# Patient Record
Sex: Female | Born: 1960 | Race: White | Hispanic: No | Marital: Married | Smoking: Never smoker
Health system: Southern US, Community
[De-identification: ages and names within clinical notes are randomized; demographics above are authoritative.]

## PROBLEM LIST (undated history)

## (undated) DIAGNOSIS — R413 Other amnesia: Secondary | ICD-10-CM

## (undated) DIAGNOSIS — F419 Anxiety disorder, unspecified: Secondary | ICD-10-CM

## (undated) HISTORY — DX: Anxiety disorder, unspecified: F41.9

## (undated) HISTORY — DX: Other amnesia: R41.3

---

## 2005-08-06 ENCOUNTER — Emergency Department (HOSPITAL_COMMUNITY): Admission: EM | Admit: 2005-08-06 | Discharge: 2005-08-07 | Payer: Self-pay | Admitting: Emergency Medicine

## 2005-12-18 ENCOUNTER — Encounter: Admission: RE | Admit: 2005-12-18 | Discharge: 2005-12-18 | Payer: Self-pay | Admitting: Emergency Medicine

## 2006-01-28 ENCOUNTER — Encounter: Admission: RE | Admit: 2006-01-28 | Discharge: 2006-01-28 | Payer: Self-pay | Admitting: Emergency Medicine

## 2006-03-04 ENCOUNTER — Encounter: Admission: RE | Admit: 2006-03-04 | Discharge: 2006-03-04 | Payer: Self-pay | Admitting: Emergency Medicine

## 2006-04-29 ENCOUNTER — Encounter: Admission: RE | Admit: 2006-04-29 | Discharge: 2006-04-29 | Payer: Self-pay | Admitting: Emergency Medicine

## 2006-11-04 IMAGING — CT CT HEAD W/O CM
1 series · 16 of 28 positions shown, 20 images · IV contrast (agent unspecified)
Comparison: none

CLINICAL DATA: Syncopal episodes.  The patient fell and hit her head.  
 HEAD CT WITHOUT CONTRAST:
TECHNIQUE: Contiguous axial images were obtained from the base of the skull through the vertex according to standard protocol without contrast.
 There is no evidence of intracranial hemorrhage, brain edema, or mass effect.  No other intra-axial abnormalities are seen, and the ventricles are within normal limits.  No abnormal extra-axial fluid collections or masses are identified.  No skull abnormalities are noted.

[Series 2: head · axial · 0.49mm/px · z∈[+30,+164]mm · 16 of 28 slices shown, 20 images]
[im 2/28  brain]
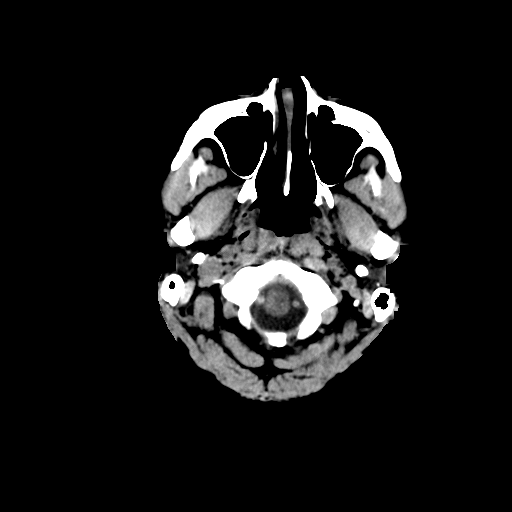
[im 2/28  bone]
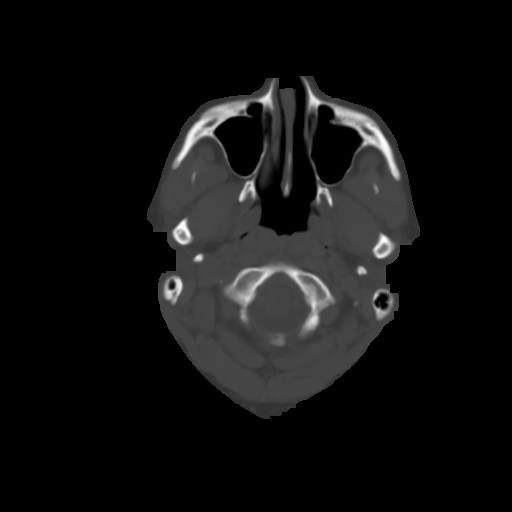
[im 4/28  brain]
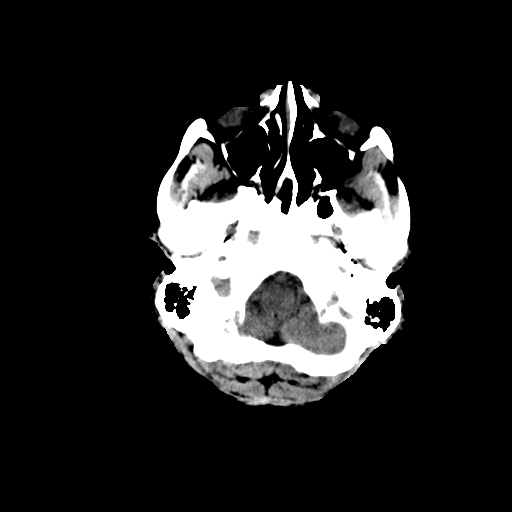
[im 6/28  brain]
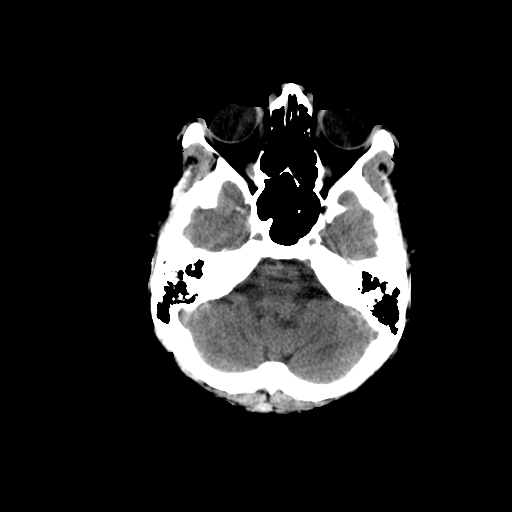
[im 7/28  brain]
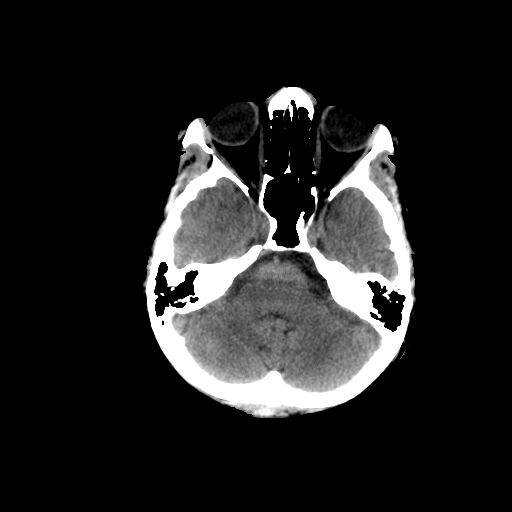
[im 9/28  brain]
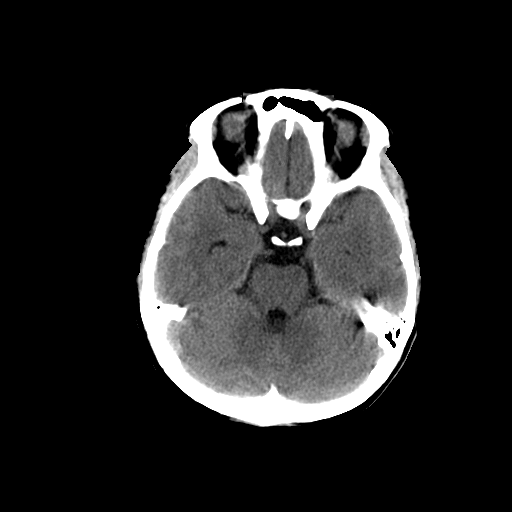
[im 9/28  bone]
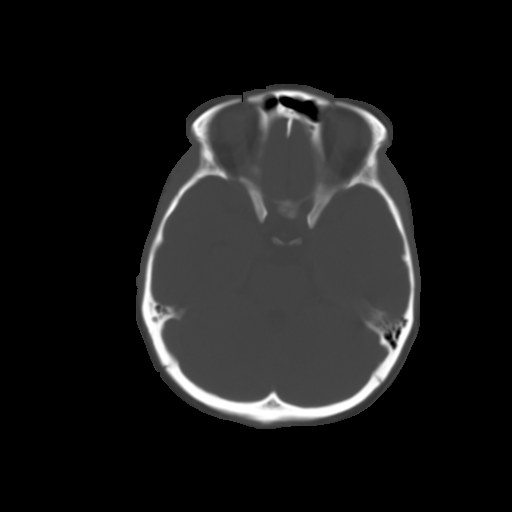
[im 10/28  brain]
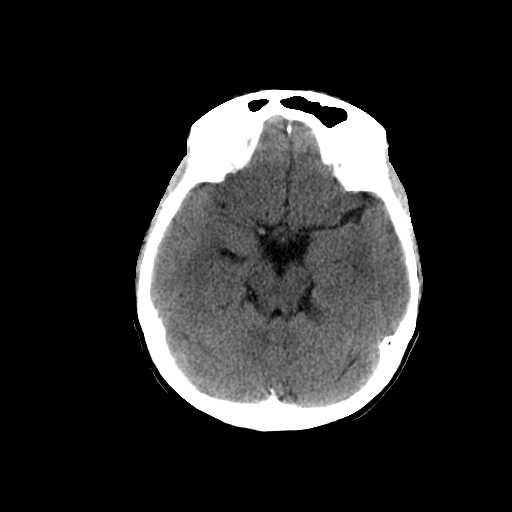
[im 12/28  brain]
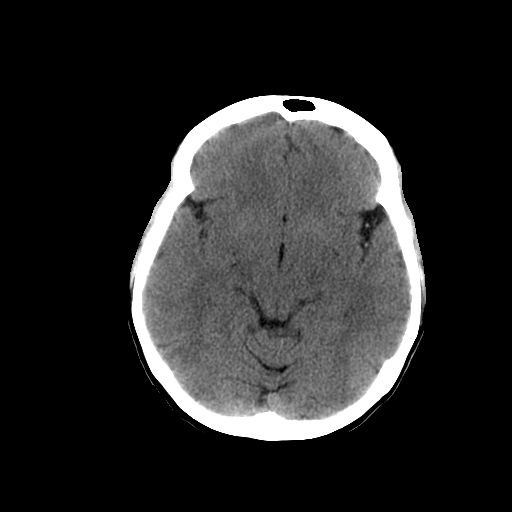
[im 14/28  brain]
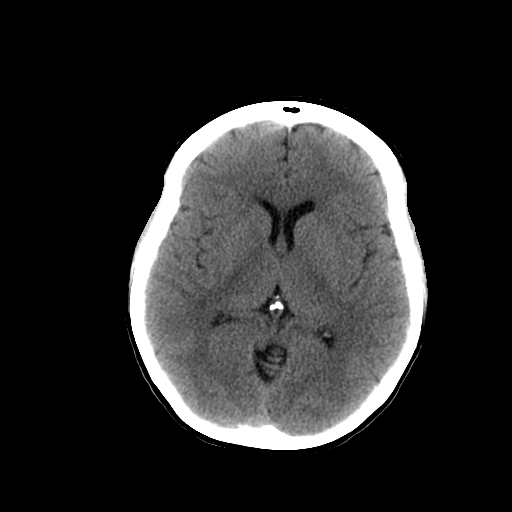
[im 15/28  brain]
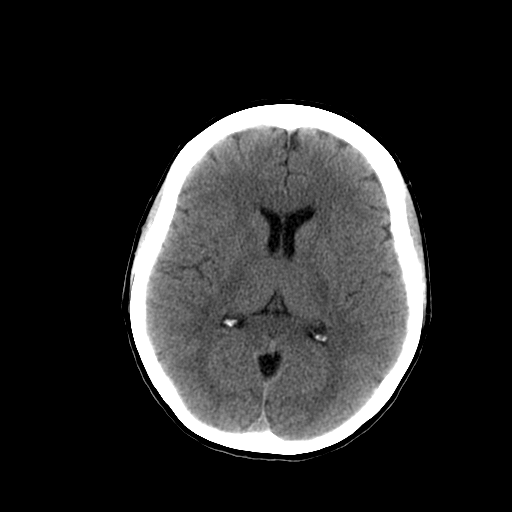
[im 15/28  bone]
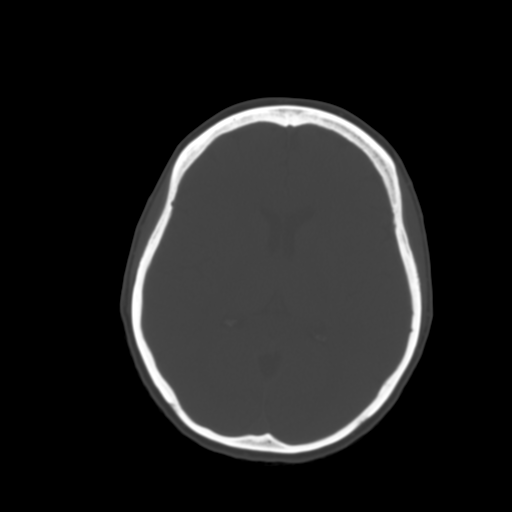
[im 17/28  brain]
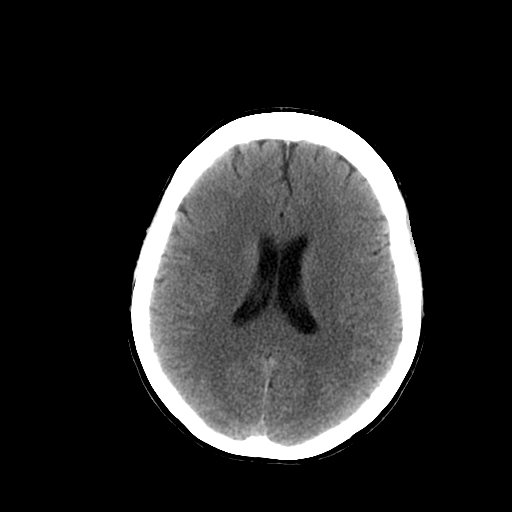
[im 19/28  brain]
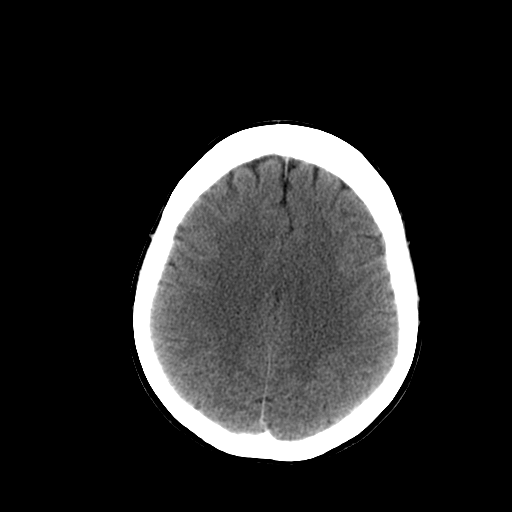
[im 20/28  brain]
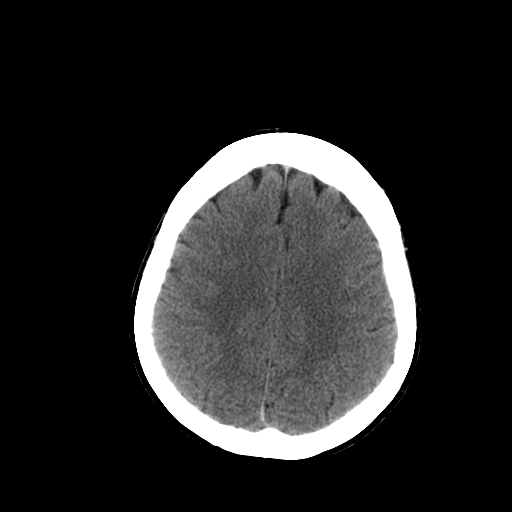
[im 22/28  brain]
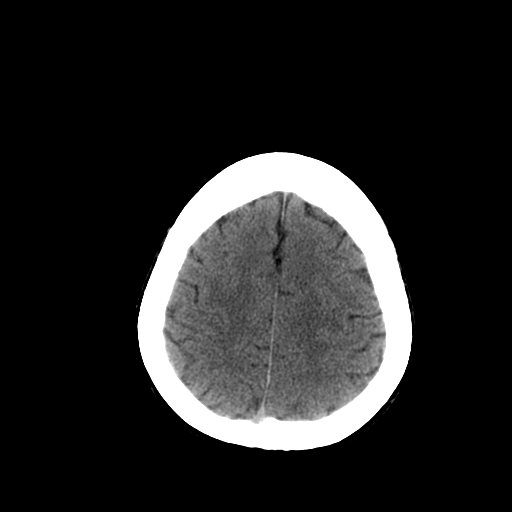
[im 22/28  bone]
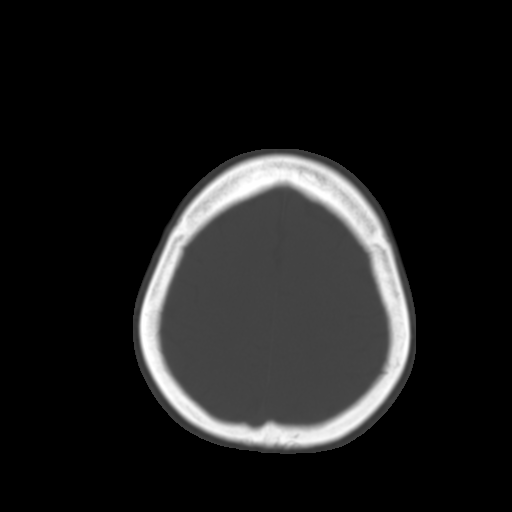
[im 23/28  brain]
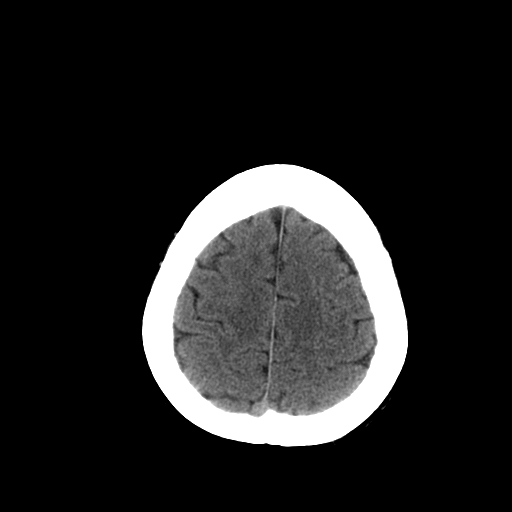
[im 25/28  brain]
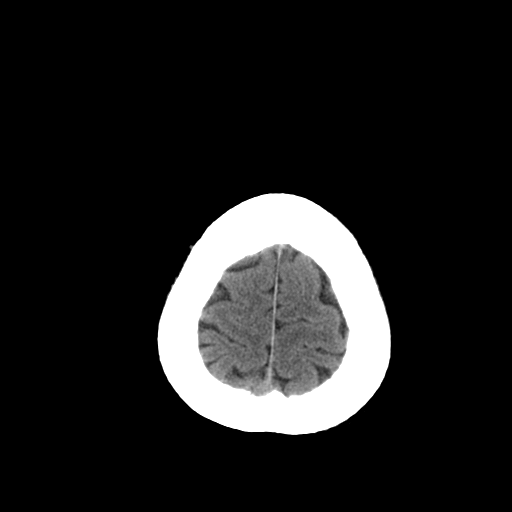
[im 27/28  brain]
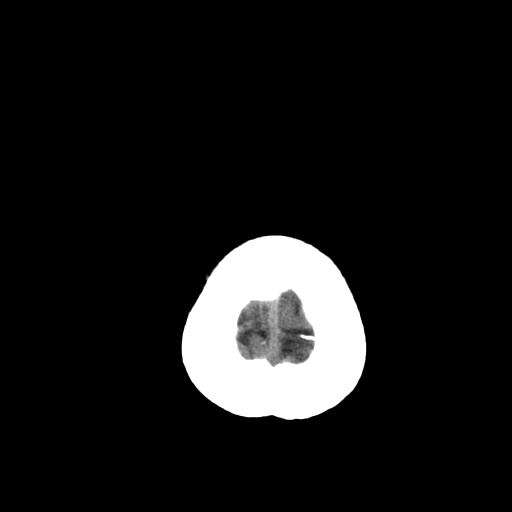

[16 of 28 positions shown; findings below may reference images not displayed]

IMPRESSION: Negative non-contrast head CT.

## 2007-06-01 ENCOUNTER — Other Ambulatory Visit: Admission: RE | Admit: 2007-06-01 | Discharge: 2007-06-01 | Payer: Self-pay | Admitting: Family Medicine

## 2008-06-06 ENCOUNTER — Other Ambulatory Visit: Admission: RE | Admit: 2008-06-06 | Discharge: 2008-06-06 | Payer: Self-pay | Admitting: Family Medicine

## 2010-12-30 ENCOUNTER — Encounter: Payer: Self-pay | Admitting: Emergency Medicine

## 2011-08-22 ENCOUNTER — Other Ambulatory Visit: Payer: Self-pay | Admitting: Family Medicine

## 2011-08-22 DIAGNOSIS — R270 Ataxia, unspecified: Secondary | ICD-10-CM

## 2011-08-25 ENCOUNTER — Ambulatory Visit
Admission: RE | Admit: 2011-08-25 | Discharge: 2011-08-25 | Disposition: A | Discharge status: Home / Self Care | Payer: BC Managed Care – PPO | Source: Ambulatory Visit | Attending: Family Medicine | Admitting: Family Medicine

## 2011-08-25 DIAGNOSIS — R270 Ataxia, unspecified: Secondary | ICD-10-CM

## 2011-08-25 MED ORDER — GADOBENATE DIMEGLUMINE 529 MG/ML IV SOLN
15.0000 mL | Freq: Once | INTRAVENOUS | Status: AC | PRN
  Administered 2011-08-25: 15 mL via INTRAVENOUS

## 2013-01-28 ENCOUNTER — Other Ambulatory Visit: Payer: Self-pay | Admitting: Family Medicine

## 2013-01-28 ENCOUNTER — Other Ambulatory Visit (HOSPITAL_COMMUNITY)
Admission: RE | Admit: 2013-01-28 | Discharge: 2013-01-28 | Disposition: A | Discharge status: Home / Self Care | Payer: BC Managed Care – PPO | Source: Ambulatory Visit | Attending: Family Medicine | Admitting: Family Medicine

## 2013-01-28 DIAGNOSIS — Z124 Encounter for screening for malignant neoplasm of cervix: Secondary | ICD-10-CM | POA: Insufficient documentation

## 2013-01-28 DIAGNOSIS — Z1151 Encounter for screening for human papillomavirus (HPV): Secondary | ICD-10-CM | POA: Insufficient documentation

## 2013-09-20 ENCOUNTER — Encounter: Payer: Self-pay | Admitting: Neurology

## 2013-09-20 ENCOUNTER — Ambulatory Visit (INDEPENDENT_AMBULATORY_CARE_PROVIDER_SITE_OTHER): Payer: BC Managed Care – PPO | Admitting: Neurology

## 2013-09-20 ENCOUNTER — Encounter (INDEPENDENT_AMBULATORY_CARE_PROVIDER_SITE_OTHER): Payer: Self-pay

## 2013-09-20 VITALS — BP 117/82 | HR 82 | Ht 64.0 in | Wt 181.0 lb

## 2013-09-20 DIAGNOSIS — R269 Unspecified abnormalities of gait and mobility: Secondary | ICD-10-CM | POA: Insufficient documentation

## 2013-09-20 DIAGNOSIS — R413 Other amnesia: Secondary | ICD-10-CM
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
## 2013-10-13 ENCOUNTER — Other Ambulatory Visit: Payer: Self-pay | Admitting: Neurology

## 2013-10-13 DIAGNOSIS — R413 Other amnesia: Secondary | ICD-10-CM

## 2014-01-21 ENCOUNTER — Ambulatory Visit: Payer: BC Managed Care – PPO | Admitting: Neurology

## 2014-02-22 ENCOUNTER — Other Ambulatory Visit: Payer: Self-pay | Admitting: Family Medicine

## 2014-02-22 DIAGNOSIS — R748 Abnormal levels of other serum enzymes: Secondary | ICD-10-CM

## 2014-02-24 ENCOUNTER — Ambulatory Visit
Admission: RE | Admit: 2014-02-24 | Discharge: 2014-02-24 | Disposition: A | Discharge status: Home / Self Care | Payer: BC Managed Care – PPO | Source: Ambulatory Visit | Attending: Family Medicine | Admitting: Family Medicine

## 2014-02-24 DIAGNOSIS — R748 Abnormal levels of other serum enzymes: Secondary | ICD-10-CM

## 2014-10-27 IMAGING — US US ABDOMEN COMPLETE
1 series · 14 of 25 positions shown · non-contrast
Comparison: None.

CLINICAL DATA: Elevated liver function tests

EXAM:
ULTRASOUND ABDOMEN COMPLETE

[Series 1: us abdomen complete · 0.31mm/px · 14 of 72 slices shown]
[im 1/72]
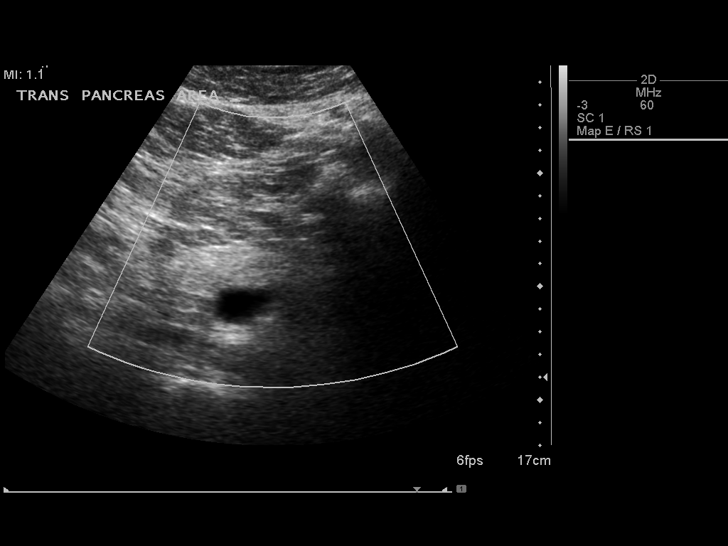
[im 6/72]
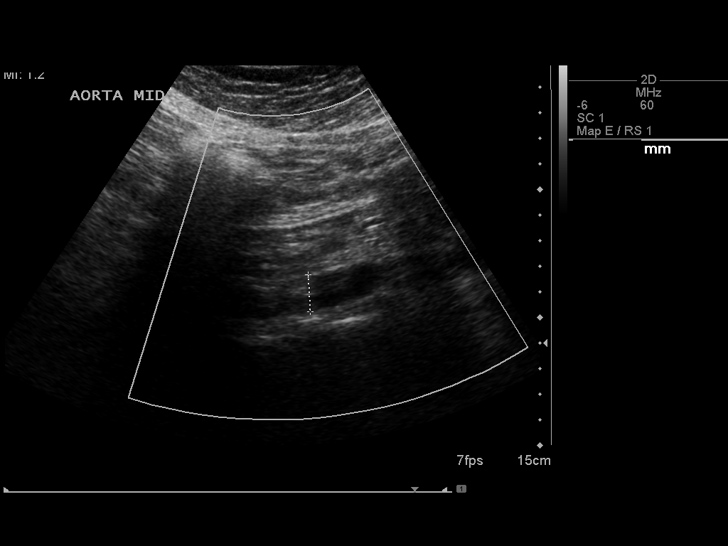
[im 12/72]
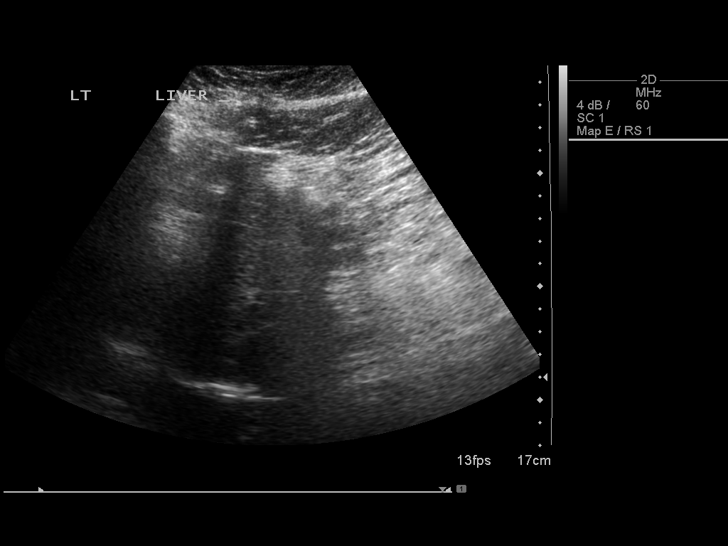
[im 18/72]
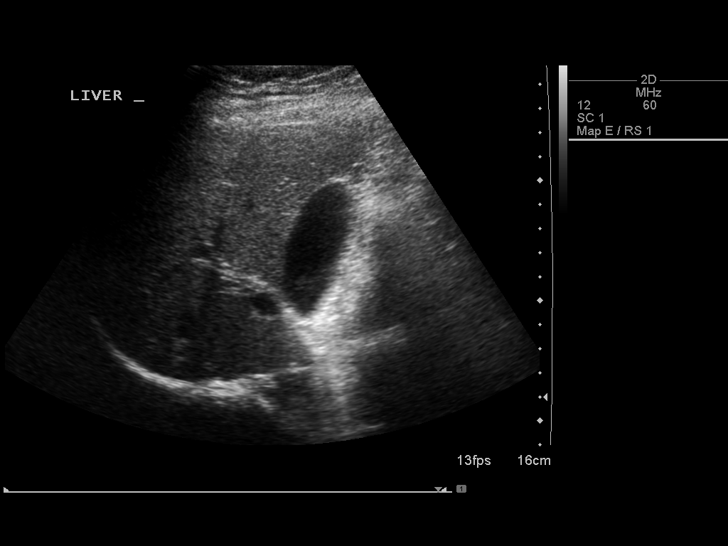
[im 24/72]
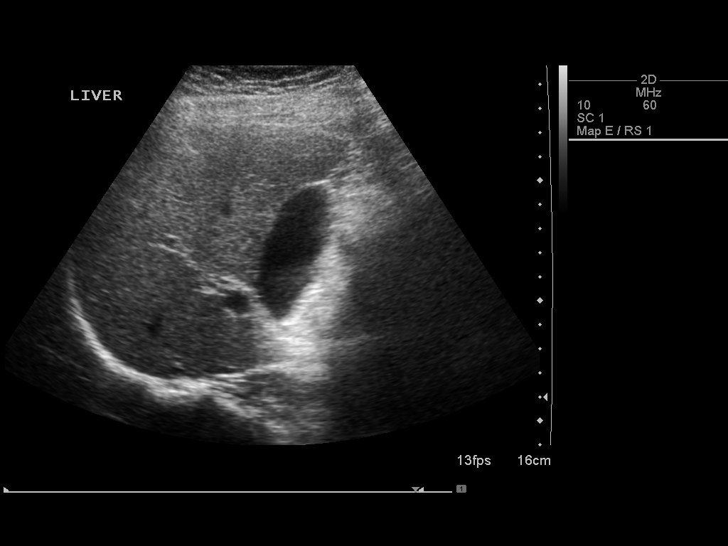
[im 27/72]
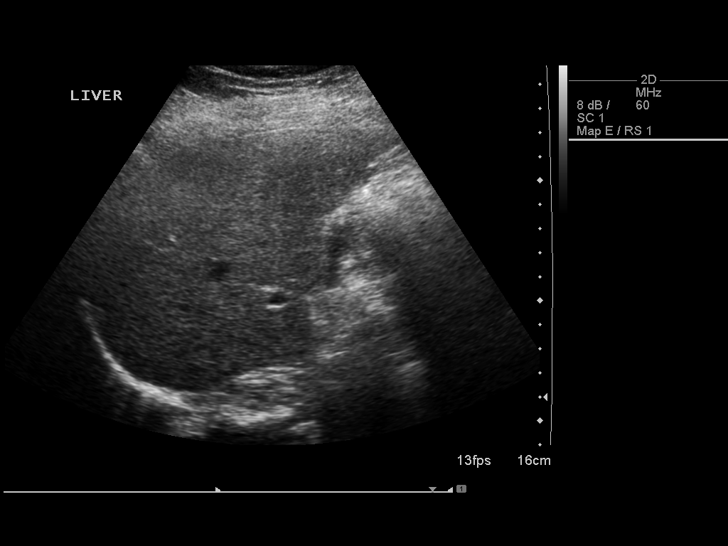
[im 33/72]
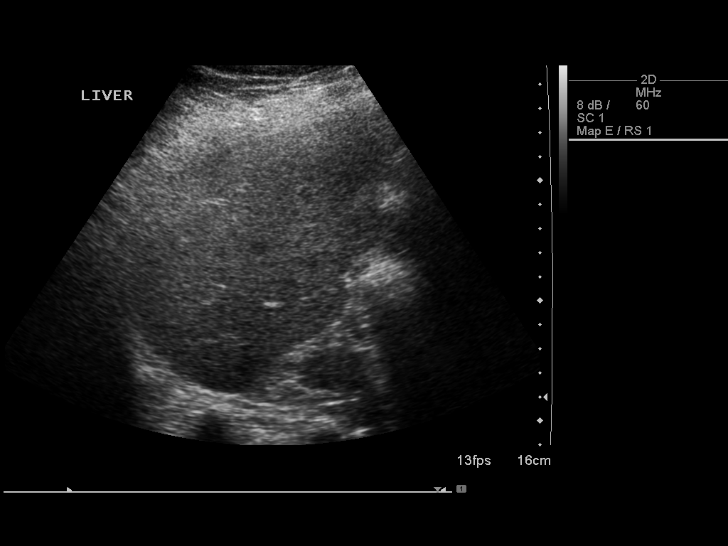
[im 39/72]
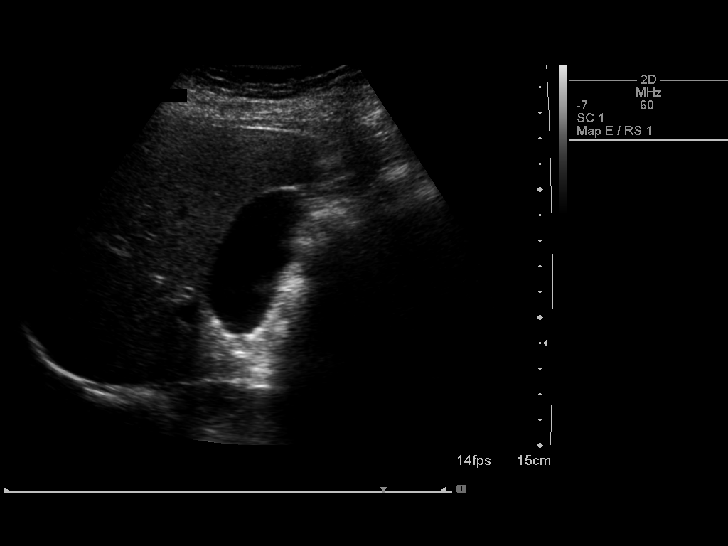
[im 45/72]
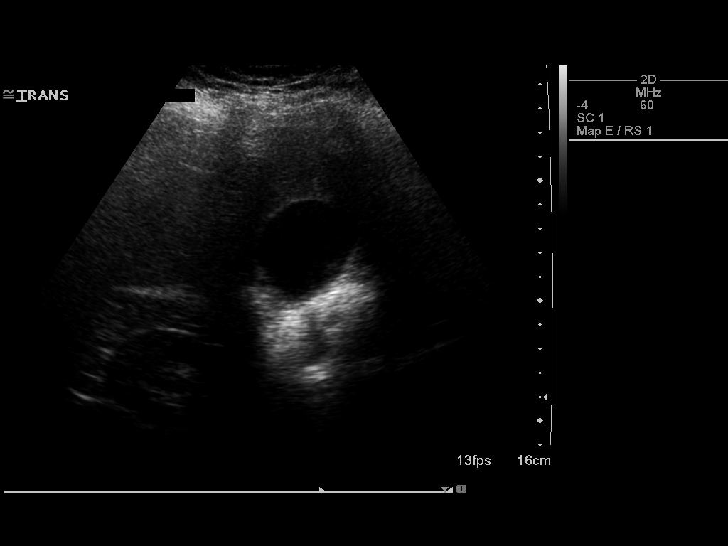
[im 48/72]
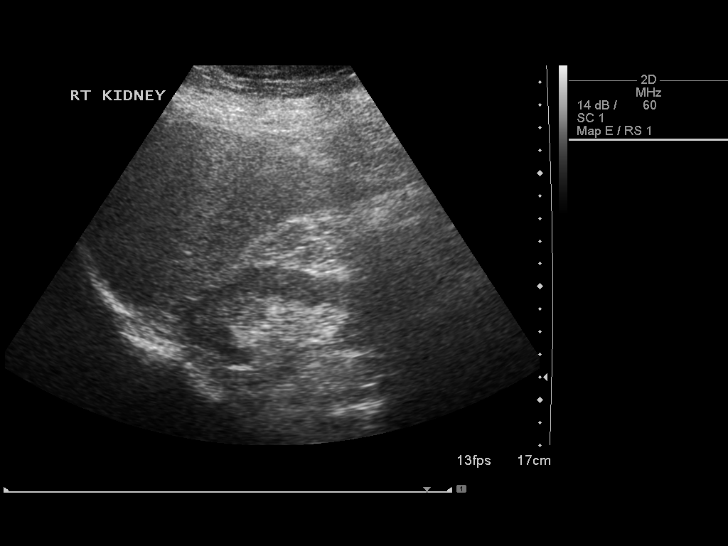
[im 54/72]
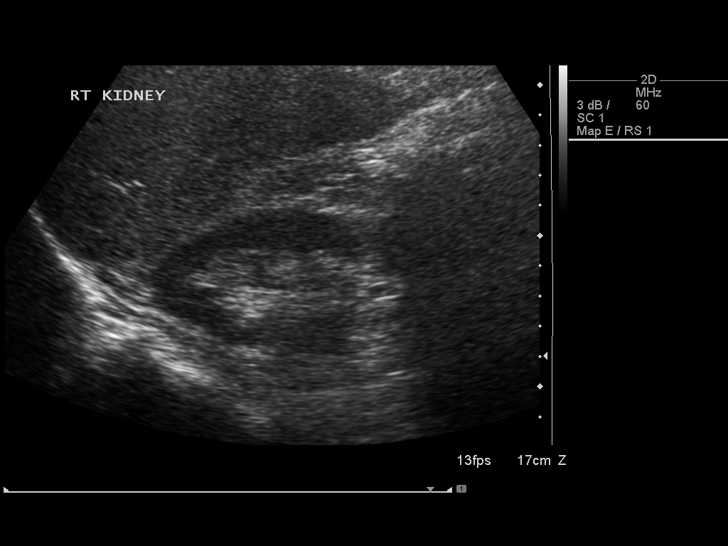
[im 60/72]
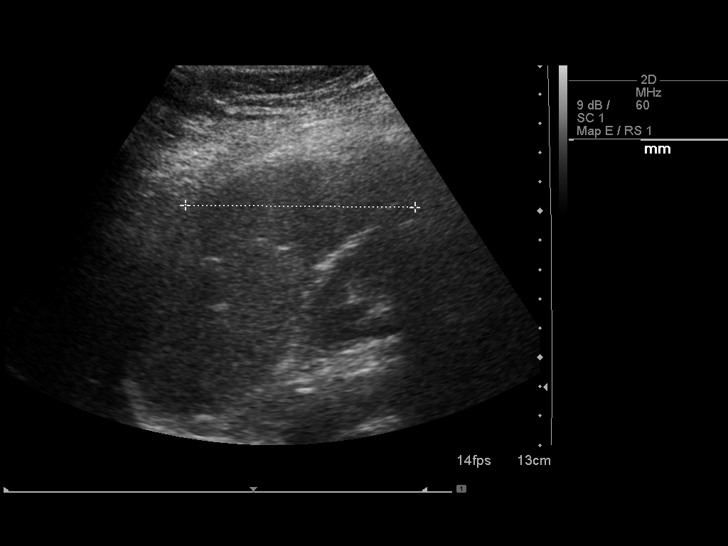
[im 66/72]
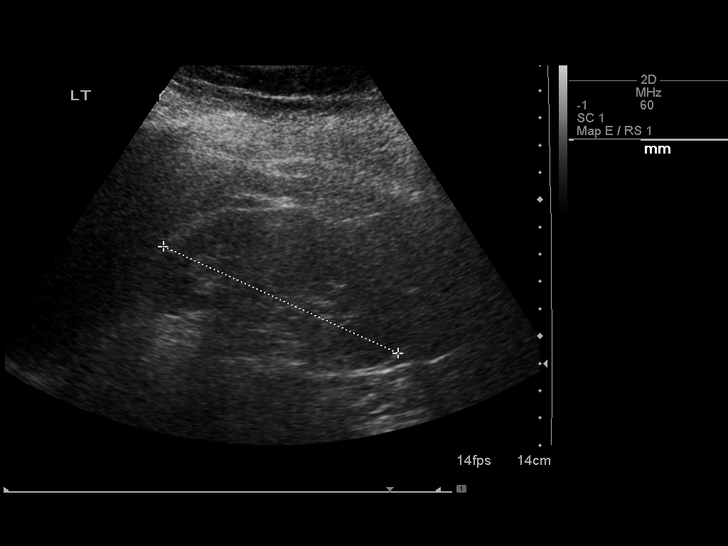
[im 72/72]
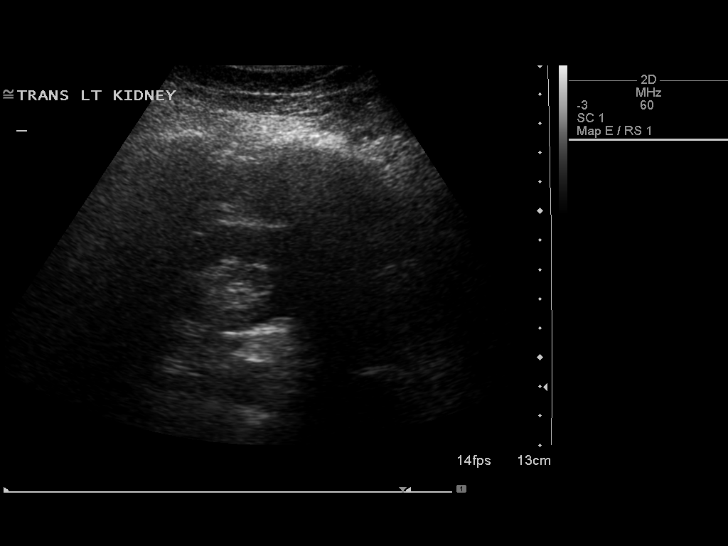

[14 of 25 positions shown; findings below may reference images not displayed]

FINDINGS: Gallbladder:

The gallbladder is visualized and no gallstones are noted. There is
no pain over the gallbladder with compression.

Common bile duct:

Diameter: The common bile duct is normal measuring 2.5 mm in
diameter.

Liver:

The liver is slightly echogenic which may indicate mild fatty
infiltration. No focal hepatic abnormality is seen.

IVC:

No abnormality visualized.

Pancreas:

The head and tail of the pancreas are obscured by bowel gas.

Spleen:

The spleen is normal measuring 7.9 cm sagittally.

Right Kidney:

Length: 9.0 cm..  No hydronephrosis is seen.

Left Kidney:

Length: 9.5 cm..  No hydronephrosis is noted.

Abdominal aorta:

The abdominal aorta is normal caliber.

Other findings:

None.
IMPRESSION: 1. Slightly echogenic liver parenchyma may indicate mild fatty
infiltration. No focal abnormality is noted.
2. No gallstones.
3. The pancreas is partially obscured by bowel gas.
